# Patient Record
Sex: Male | Born: 1985
Health system: Southern US, Community
[De-identification: ages and names within clinical notes are randomized; demographics above are authoritative.]

## PROBLEM LIST (undated history)

## (undated) HISTORY — PX: HAND SURGERY: SHX662

---

## 2010-03-11 ENCOUNTER — Emergency Department (HOSPITAL_BASED_OUTPATIENT_CLINIC_OR_DEPARTMENT_OTHER)
Admission: EM | Admit: 2010-03-11 | Discharge: 2010-03-11 | Payer: Self-pay | Source: Home / Self Care | Admitting: Emergency Medicine

## 2011-03-19 ENCOUNTER — Encounter (HOSPITAL_BASED_OUTPATIENT_CLINIC_OR_DEPARTMENT_OTHER): Payer: Self-pay | Admitting: *Deleted

## 2011-03-19 ENCOUNTER — Emergency Department (HOSPITAL_BASED_OUTPATIENT_CLINIC_OR_DEPARTMENT_OTHER)
Admission: EM | Admit: 2011-03-19 | Discharge: 2011-03-19 | Disposition: A | Discharge status: Home / Self Care | Payer: Self-pay | Attending: Emergency Medicine | Admitting: Emergency Medicine

## 2011-03-19 DIAGNOSIS — K529 Noninfective gastroenteritis and colitis, unspecified: Secondary | ICD-10-CM

## 2011-03-19 DIAGNOSIS — R197 Diarrhea, unspecified: Secondary | ICD-10-CM | POA: Insufficient documentation

## 2011-03-19 DIAGNOSIS — R109 Unspecified abdominal pain: Secondary | ICD-10-CM | POA: Insufficient documentation

## 2011-03-19 DIAGNOSIS — K5289 Other specified noninfective gastroenteritis and colitis: Secondary | ICD-10-CM | POA: Insufficient documentation

## 2011-03-19 DIAGNOSIS — F17200 Nicotine dependence, unspecified, uncomplicated: Secondary | ICD-10-CM | POA: Insufficient documentation

## 2011-03-19 NOTE — ED Notes (Signed)
Pt states he is here because he has been out of work x 3 days and they will not let him come back until he has a work note

## 2011-03-19 NOTE — ED Provider Notes (Signed)
History     CSN: [id number]  Arrival date & time 03/19/11  1649   First MD Initiated Contact with Patient 03/19/11 1703      Chief Complaint  Patient presents with  . Abdominal Pain  . Diarrhea    (Consider location/radiation/quality/duration/timing/severity/associated sxs/prior treatment) HPI Patient reports had diarrhea with diffuse nonradiating gas-like abdominal pain upon awakening for the past 3 days. All symptoms have resolved today he's had no episodes of diarrhea today. Had 6 episodes of diarrhea yesterday. No treatment today. Yesterday treated himself with Pepto-Bismol without relief. No fever no vomiting no other associated symptoms presently asymptomatic. Requesting a work note so that he can go back to work History reviewed. No pertinent past medical history. Negative History reviewed. No pertinent past surgical history.  History reviewed. No pertinent family history.  History  Substance Use Topics  . Smoking status: Current Everyday Smoker -- 0.5 packs/day  . Smokeless tobacco: Not on file  . Alcohol Use: No      Review of Systems  Constitutional: Negative.   HENT: Negative.   Respiratory: Negative.   Cardiovascular: Negative.   Gastrointestinal: Positive for abdominal pain and diarrhea.  Musculoskeletal: Negative.   Skin: Negative.   Neurological: Negative.   Hematological: Negative.   Psychiatric/Behavioral: Negative.     Allergies  Review of patient's allergies indicates no known allergies.  Home Medications  No current outpatient prescriptions on file.  BP 136/82  Pulse 75  Temp 99.4 F (37.4 C)  Resp 16  Ht 6' (1.829 m)  Wt 225 lb (102.059 kg)  BMI 30.52 kg/m2  SpO2 100%  Physical Exam  Constitutional: He appears well-developed and well-nourished.  HENT:  Head: Normocephalic and atraumatic.  Eyes: Conjunctivae are normal. Pupils are equal, round, and reactive to light.  Neck: Neck supple. No tracheal deviation present. No  thyromegaly present.  Cardiovascular: Normal rate and regular rhythm.   No murmur heard. Pulmonary/Chest: Effort normal and breath sounds normal.  Abdominal: Soft. Bowel sounds are normal. He exhibits no distension and no mass. There is no tenderness. There is no rebound and no guarding.  Genitourinary: Penis normal.  Musculoskeletal: Normal range of motion. He exhibits no edema and no tenderness.  Neurological: He is alert. Coordination normal.  Skin: Skin is warm and dry. No rash noted.  Psychiatric: He has a normal mood and affect.    ED Course  Procedures (including critical care time)  Labs Reviewed - No data to display No results found.   No diagnosis found.    MDM  No further treatment needed Diagnosis : enteritis-resolved        Doug Sou, MD 03/19/11 1726

## 2011-03-19 NOTE — ED Notes (Signed)
Pt c/o abd cramping and diarrhea x 3 days

## 2011-04-12 ENCOUNTER — Encounter (HOSPITAL_BASED_OUTPATIENT_CLINIC_OR_DEPARTMENT_OTHER): Payer: Self-pay | Admitting: *Deleted

## 2011-04-12 ENCOUNTER — Emergency Department (HOSPITAL_BASED_OUTPATIENT_CLINIC_OR_DEPARTMENT_OTHER)
Admission: EM | Admit: 2011-04-12 | Discharge: 2011-04-12 | Disposition: A | Discharge status: Home / Self Care | Payer: Self-pay | Attending: Emergency Medicine | Admitting: Emergency Medicine

## 2011-04-12 DIAGNOSIS — F17200 Nicotine dependence, unspecified, uncomplicated: Secondary | ICD-10-CM | POA: Insufficient documentation

## 2011-04-12 DIAGNOSIS — J069 Acute upper respiratory infection, unspecified: Secondary | ICD-10-CM | POA: Insufficient documentation

## 2011-04-12 MED ORDER — IPRATROPIUM BROMIDE 0.03 % NA SOLN: 2.0000 | Freq: Two times a day (BID) | NASAL | Status: AC

## 2011-04-12 NOTE — ED Notes (Signed)
Pt states he has been congested x 3 days. Prod cough with yellow, white sputum. Some CP with cough. Hx allergies.

## 2011-04-12 NOTE — ED Provider Notes (Signed)
History   This chart was scribed for Toy Baker, MD scribed by Magnus Sinning. The patient was seen in room MH09/MH09 seen at 16:06   CSN: [id number]  Arrival date & time 04/12/11  4132   First MD Initiated Contact with Patient 04/12/11 1558      Chief Complaint  Patient presents with  . Nasal Congestion    (Consider location/radiation/quality/duration/timing/severity/associated sxs/prior treatment) HPI Jeffrey Wood is a 26 y.o. male who presents to the Emergency Department complaining of constant moderate throat pain with associated nasal congestion, mild chest pain and cough onset three days ago. Pt says throat pain is worse in the morning and adds that it is swollen and hurts to swallow. He has been having congestion discharge that he describes is yellow. Denies fever, otalgias ,vomiting, diarrhea. Pt is a current smoker.  History reviewed. No pertinent past medical history.  History reviewed. No pertinent past surgical history.  History reviewed. No pertinent family history.  History  Substance Use Topics  . Smoking status: Current Everyday Smoker -- 0.5 packs/day  . Smokeless tobacco: Not on file  . Alcohol Use: No      Review of Systems  Constitutional: Negative for fever.  HENT: Positive for congestion. Negative for ear pain.   Respiratory: Positive for cough.   Cardiovascular: Positive for chest pain.  Gastrointestinal: Negative for vomiting and diarrhea.  All other systems reviewed and are negative.    Allergies  Review of patient's allergies indicates no known allergies.  Home Medications  No current outpatient prescriptions on file.  BP 143/88  Pulse 84  Temp(Src) 98.1 F (36.7 C) (Oral)  Resp 20  Ht 6' (1.829 m)  Wt 240 lb (108.863 kg)  BMI 32.55 kg/m2  SpO2 98%  Physical Exam  Nursing note and vitals reviewed. Constitutional: He is oriented to person, place, and time. He appears well-developed and well-nourished. No distress.  HENT:    Head: Normocephalic and atraumatic.  Right Ear: External ear normal.  Left Ear: External ear normal.  Mouth/Throat: Oropharynx is clear and moist.       No sinus pain  Eyes: EOM are normal. Pupils are equal, round, and reactive to light.  Neck: Normal range of motion. Neck supple. No tracheal deviation present.       No lymphadenopathy   Cardiovascular: Normal rate.   Pulmonary/Chest: Effort normal. No respiratory distress.  Abdominal: Soft. He exhibits no distension.  Musculoskeletal: Normal range of motion. He exhibits no edema.  Neurological: He is alert and oriented to person, place, and time. No sensory deficit.  Skin: Skin is warm and dry.  Psychiatric: He has a normal mood and affect. His behavior is normal.    ED Course  Procedures (including critical care time) DIAGNOSTIC STUDIES: Oxygen Saturation is 98% on room air, normal by my interpretation.    COORDINATION OF CARE: 16:12: Physician informs intent to d/c patient home with nasal spray with recommendations. Patient agrees with plan of action set at this time.  Labs Reviewed - No data to display No results found.   No diagnosis found.    MDM  I personally performed the services described in this documentation, which was scribed in my presence. The recorded information has been reviewed and considered.  Pt to be treated for a Orma Flaming, MD 04/12/11 [phone]

## 2011-04-12 NOTE — Discharge Instructions (Signed)

## 2011-04-12 NOTE — ED Notes (Signed)
rx x 1 given for atrovent

## 2016-03-04 ENCOUNTER — Emergency Department (HOSPITAL_BASED_OUTPATIENT_CLINIC_OR_DEPARTMENT_OTHER)
Admission: EM | Admit: 2016-03-04 | Discharge: 2016-03-04 | Disposition: A | Discharge status: Home / Self Care | Payer: Self-pay | Attending: Emergency Medicine | Admitting: Emergency Medicine

## 2016-03-04 ENCOUNTER — Emergency Department (HOSPITAL_BASED_OUTPATIENT_CLINIC_OR_DEPARTMENT_OTHER): Payer: Self-pay

## 2016-03-04 ENCOUNTER — Encounter (HOSPITAL_BASED_OUTPATIENT_CLINIC_OR_DEPARTMENT_OTHER): Payer: Self-pay | Admitting: *Deleted

## 2016-03-04 DIAGNOSIS — R079 Chest pain, unspecified: Secondary | ICD-10-CM

## 2016-03-04 DIAGNOSIS — F17200 Nicotine dependence, unspecified, uncomplicated: Secondary | ICD-10-CM | POA: Insufficient documentation

## 2016-03-04 DIAGNOSIS — R0789 Other chest pain: Secondary | ICD-10-CM | POA: Insufficient documentation

## 2016-03-04 LAB — COMPREHENSIVE METABOLIC PANEL
ALBUMIN: 4.6 g/dL (ref 3.5–5.0)
ALT: 24 U/L (ref 17–63)
AST: 20 U/L (ref 15–41)
Alkaline Phosphatase: 61 U/L (ref 38–126)
Anion gap: 8 (ref 5–15)
BUN: 12 mg/dL (ref 6–20)
CHLORIDE: 102 mmol/L (ref 101–111)
CO2: 30 mmol/L (ref 22–32)
Calcium: 9.1 mg/dL (ref 8.9–10.3)
Creatinine, Ser: 1 mg/dL (ref 0.61–1.24)
GFR calc Af Amer: >60 mL/min (ref >60)
GFR calc non Af Amer: >60 mL/min (ref >60)
GLUCOSE: 84 mg/dL (ref 65–99)
POTASSIUM: 3.4 mmol/L — AB (ref 3.5–5.1)
Sodium: 140 mmol/L (ref 135–145)
Total Bilirubin: 0.6 mg/dL (ref 0.3–1.2)
Total Protein: 7.5 g/dL (ref 6.5–8.1)

## 2016-03-04 LAB — CBC WITH DIFFERENTIAL/PLATELET
BASOS PCT: 0 %
Basophils Absolute: 0 10*3/uL (ref 0.0–0.1)
EOS PCT: 2 %
Eosinophils Absolute: 0.1 10*3/uL (ref 0.0–0.7)
HCT: 44.8 % (ref 39.0–52.0)
Hemoglobin: 15.1 g/dL (ref 13.0–17.0)
Lymphocytes Relative: 41 %
Lymphs Abs: 2.4 10*3/uL (ref 0.7–4.0)
MCH: 27 pg (ref 26.0–34.0)
MCHC: 33.7 g/dL (ref 30.0–36.0)
MCV: 80 fL (ref 78.0–100.0)
MONO ABS: 0.4 10*3/uL (ref 0.1–1.0)
Monocytes Relative: 7 %
NEUTROS ABS: 2.9 10*3/uL (ref 1.7–7.7)
Neutrophils Relative %: 50 %
PLATELETS: 188 10*3/uL (ref 150–400)
RBC: 5.6 MIL/uL (ref 4.22–5.81)
RDW: 13.1 % (ref 11.5–15.5)
WBC: 5.8 10*3/uL (ref 4.0–10.5)

## 2016-03-04 LAB — TROPONIN I

## 2016-03-04 MED ORDER — ACETAMINOPHEN 500 MG PO TABS
1000.0000 mg | ORAL_TABLET | Freq: Once | ORAL | Status: AC
  Administered 2016-03-04: 1000 mg via ORAL
  Filled 2016-03-04: qty 2

## 2016-03-04 NOTE — ED Provider Notes (Signed)
[facility]   CSN: [id number] Arrival date & time: 03/04/16  1740   By signing my name below, I, Jeffrey Wood, attest that this documentation has been prepared under the direction and in the presence of Jeffrey Conn, MD . Electronically Signed: Nelwyn Wood, Scribe. 03/04/2016. 9:46 PM.  History   Chief Complaint Chief Complaint  Patient presents with  . Chest Pain   The history is provided by the patient and a relative. No language interpreter was used.    HPI Comments:  Jeffrey Wood is an otherwise healthy 31 y.o. male who presents to the Emergency Department complaining of intermittent, unchanged chest pain onset this week. He describes his symptoms as a pressure-like pain that is worse when he is laying down. Pt reports associated headaches and heart palpitations. He describes his palpitations as if his heart is "fluttering". Pt has not taken anything at home for his symptoms. He denies any lower extremity swelling, rhinorrhea, cough, congestion, SOB or vomiting. Pt is an everyday smoker, does not drink alcohol, and has no hx of recreation drug use. He has not traveled recently and has no hx of blood clots.   History reviewed. No pertinent past medical history.  There are no active problems to display for this patient.   Past Surgical History:  Procedure Laterality Date  . HAND SURGERY      Home Medications    Prior to Admission medications   Medication Sig Start Date End Date Taking? Authorizing Provider  ipratropium (ATROVENT) 0.03 % nasal spray Place 2 sprays into the nose every 12 (twelve) hours. 04/12/11 04/11/12  Lorre Nick, MD    Family History No family history on file.  Social History Social History  Substance Use Topics  . Smoking status: Current Every Day Smoker    Packs/day: 0.50  . Smokeless tobacco: Never Used  . Alcohol use No     Allergies   Patient has no known allergies.   Review of Systems Review of  Systems 10 Systems reviewed and are negative for acute change except as noted in the HPI.  Physical Exam Updated Vital Signs BP 145/79 (BP Location: Left Arm)   Pulse 65   Temp 98.4 F (36.9 C) (Oral)   Resp 20   Ht 6' (1.829 m)   Wt 205 lb (93 kg)   SpO2 100%   BMI 27.80 kg/m   Physical Exam  Constitutional: He is oriented to person, place, and time. He appears well-developed and well-nourished. No distress.  HENT:  Head: Normocephalic and atraumatic.  Nose: Nose normal.  Eyes: Conjunctivae and EOM are normal. Pupils are equal, round, and reactive to light. Right eye exhibits no discharge. Left eye exhibits no discharge. No scleral icterus.  Neck: Normal range of motion. Neck supple.  Cardiovascular: Normal rate and regular rhythm.  Exam reveals no gallop and no friction rub.   No murmur heard. Pulmonary/Chest: Effort normal and breath sounds normal. No stridor. No respiratory distress. He has no rales.  Abdominal: Soft. He exhibits no distension. There is no tenderness.  Musculoskeletal: He exhibits no edema or tenderness.  Neurological: He is alert and oriented to person, place, and time.  Skin: Skin is warm and dry. No rash noted. He is not diaphoretic. No erythema.  Psychiatric: He has a normal mood and affect.  Vitals reviewed.    ED Treatments / Results  DIAGNOSTIC STUDIES:  Oxygen Saturation is 100% on RA, normal by my interpretation.    COORDINATION  OF CARE:  9:47 PM Discussed treatment plan with pt at bedside which includes referral to PCP and he agreed to plan. Pt educated on potential for acid reflux.   Labs (all labs ordered are listed, but only abnormal results are displayed) Labs Reviewed  COMPREHENSIVE METABOLIC PANEL - Abnormal; Notable for the following:       Result Value   Potassium 3.4 (*)    All other components within normal limits  CBC WITH DIFFERENTIAL/PLATELET  TROPONIN I    EKG  EKG Interpretation  Date/Time:  Tuesday March 04 2016 17:52:49 EST Ventricular Rate:  58 PR Interval:  140 QRS Duration: 108 QT Interval:  412 QTC Calculation: 404 R Axis:   28 Text Interpretation:  Sinus bradycardia Incomplete right bundle branch block Borderline ECG No old tracing to compare Confirmed by [facility]CARDAMA MD, Jeffrey Wood [phone]([id number]) on 03/04/2016 6:09:42 PM       Radiology Dg Chest 2 View  Result Date: 03/04/2016 CLINICAL DATA:  Chest pain and heart palpitations for 1 week EXAM: CHEST  2 VIEW COMPARISON:  None. FINDINGS: The heart size and mediastinal contours are within normal limits. Both lungs are clear. The visualized skeletal structures are unremarkable. IMPRESSION: No active cardiopulmonary disease. Electronically Signed   By: Jeffrey Wood  Jeffrey Wood M.D.   On: 03/04/2016 19:43    Procedures Procedures (including critical care time)  Medications Ordered in ED Medications  acetaminophen (TYLENOL) tablet 1,000 mg (1,000 mg Oral Given 03/04/16 2156)     Initial Impression / Assessment and Plan / ED Course  I have reviewed the triage vital signs and the nursing notes.  Pertinent labs & imaging results that were available during my care of the patient were reviewed by me and considered in my medical decision making (see chart for details).  Clinical Course     Atypical chest pain highly inconsistent with ACS. EKG without acute ischemic changes or evidence of pericarditis. Triage labs reassuring with negative troponin. HEAR <2. Do not feel that additional cardiac workup is necessary at this time.  PERC negative. Doubt PE.  Chest x-ray without evidence suggestive of pneumonia, pneumothorax, pneumomediastinum.  No abnormal contour of the mediastinum to suggest dissection. No evidence of acute injuries.  Presentation is classic for aortic dissection or esophageal perforation.  The patient is safe for discharge with strict return precautions.   Final Clinical Impressions(s) / ED Diagnoses   Final diagnoses:  Nonspecific chest pain     Disposition: Discharge  Condition: Good  I have discussed the results, Dx and Tx plan with the patient who expressed understanding and agree(s) with the plan. Discharge instructions discussed at great length. The patient was given strict return precautions who verbalized understanding of the instructions. No further questions at time of discharge.    Discharge Medication List as of 03/04/2016  9:50 PM      Follow Up: primary care provider  Schedule an appointment as soon as possible for a visit     I personally performed the services described in this documentation, which was scribed in my presence. The recorded information has been reviewed and is accurate.        Jeffrey ConnPedro Eduardo Cardama, MD 03/05/16 [phone]

## 2016-03-04 NOTE — ED Triage Notes (Signed)
Heaviness in his chest and heart fluttering for a week.

## 2016-03-04 NOTE — ED Notes (Signed)
Pt asked if he could drink some coffee. Pt instructed to not eat or drink until MD has evaluated pt.

## 2018-05-31 ENCOUNTER — Emergency Department (HOSPITAL_BASED_OUTPATIENT_CLINIC_OR_DEPARTMENT_OTHER)
Admission: EM | Admit: 2018-05-31 | Discharge: 2018-05-31 | Disposition: A | Discharge status: Home / Self Care | Payer: Self-pay | Attending: Emergency Medicine | Admitting: Emergency Medicine

## 2018-05-31 ENCOUNTER — Encounter (HOSPITAL_BASED_OUTPATIENT_CLINIC_OR_DEPARTMENT_OTHER): Payer: Self-pay

## 2018-05-31 ENCOUNTER — Other Ambulatory Visit: Payer: Self-pay

## 2018-05-31 DIAGNOSIS — F17200 Nicotine dependence, unspecified, uncomplicated: Secondary | ICD-10-CM | POA: Insufficient documentation

## 2018-05-31 DIAGNOSIS — R42 Dizziness and giddiness: Secondary | ICD-10-CM | POA: Insufficient documentation

## 2018-05-31 DIAGNOSIS — Z79899 Other long term (current) drug therapy: Secondary | ICD-10-CM | POA: Insufficient documentation

## 2018-05-31 LAB — CBC WITH DIFFERENTIAL/PLATELET
Abs Immature Granulocytes: 0.01 10*3/uL (ref 0.00–0.07)
Basophils Absolute: 0 10*3/uL (ref 0.0–0.1)
Basophils Relative: 0 %
Eosinophils Absolute: 0.1 10*3/uL (ref 0.0–0.5)
Eosinophils Relative: 2 %
HCT: 46.6 % (ref 39.0–52.0)
Hemoglobin: 15 g/dL (ref 13.0–17.0)
Immature Granulocytes: 0 %
Lymphocytes Relative: 40 %
Lymphs Abs: 2.3 10*3/uL (ref 0.7–4.0)
MCH: 26.9 pg (ref 26.0–34.0)
MCHC: 32.2 g/dL (ref 30.0–36.0)
MCV: 83.5 fL (ref 80.0–100.0)
Monocytes Absolute: 0.4 10*3/uL (ref 0.1–1.0)
Monocytes Relative: 6 %
Neutro Abs: 3 10*3/uL (ref 1.7–7.7)
Neutrophils Relative %: 52 %
Platelets: 184 10*3/uL (ref 150–400)
RBC: 5.58 MIL/uL (ref 4.22–5.81)
RDW: 13.3 % (ref 11.5–15.5)
WBC: 5.9 10*3/uL (ref 4.0–10.5)
nRBC: 0 % (ref 0.0–0.2)

## 2018-05-31 LAB — BASIC METABOLIC PANEL
Anion gap: 10 (ref 5–15)
BUN: 20 mg/dL (ref 6–20)
CO2: 23 mmol/L (ref 22–32)
Calcium: 8.8 mg/dL — ABNORMAL LOW (ref 8.9–10.3)
Chloride: 103 mmol/L (ref 98–111)
Creatinine, Ser: 1.11 mg/dL (ref 0.61–1.24)
GFR calc Af Amer: >60 mL/min (ref >60)
GFR calc non Af Amer: >60 mL/min (ref >60)
Glucose, Bld: 109 mg/dL — ABNORMAL HIGH (ref 70–99)
Potassium: 3.8 mmol/L (ref 3.5–5.1)
Sodium: 136 mmol/L (ref 135–145)

## 2018-05-31 MED ORDER — MECLIZINE HCL 25 MG PO TABS
25.0000 mg | ORAL_TABLET | Freq: Once | ORAL | Status: AC
  Administered 2018-05-31: 25 mg via ORAL
  Filled 2018-05-31: qty 1

## 2018-05-31 MED ORDER — MECLIZINE HCL 25 MG PO TABS: 25.0000 mg | ORAL_TABLET | Freq: Three times a day (TID) | ORAL | 0 refills | Status: AC | PRN

## 2018-05-31 MED ORDER — ONDANSETRON 4 MG PO TBDP: 4.0000 mg | ORAL_TABLET | Freq: Three times a day (TID) | ORAL | 0 refills | Status: AC | PRN

## 2018-05-31 MED FILL — ONDANSETRON ODT 4 MG TABLET: 4 | 3 days supply | Qty: 10 | Fill #0

## 2018-05-31 NOTE — ED Triage Notes (Addendum)
Pt c/o dizziness when he is lying down and turns from side to side x "months" causing him to have difficulty sleeping-NAD-steady gait

## 2018-05-31 NOTE — ED Notes (Signed)
C/o dizziness when lying and turns head side to side

## 2018-05-31 NOTE — ED Provider Notes (Signed)
[facility] EMERGENCY DEPARTMENT Provider Note   CSN: [id number] Arrival date & time: 05/31/18  1342  History   Chief Complaint Chief Complaint  Patient presents with  . Dizziness    HPI Jeffrey Wood is a 33 y.o. male with no significant past medical history who presents for evaluation of dizziness.  Patient states he has had intermittent episodes of dizziness x months.  Patient states symptoms well occur suddenly and he will get them frequently for 1-2 days and then resolved.  Patient states symptoms occur when he is moving quickly, turns his head quickly, rolls over in bed.  Patient states if he lays still and does not move symptoms resolve.  Patient states occasionally he will get tinnitus in his ears, however the last few times he has had these episodes of dizziness he has not had the tinnitus.  Denies any recent history of head trauma or injury.  He denies any coagulation.  Denies fever, chills, headache, facial asymmetry, slurred speech, vision changes, unilateral weakness, chest pain, shortness of breath, neck pain, neck stiffness, abdominal pain, diarrhea or dysuria, tremors, palpitations.  Patient states he is ambulatory without difficulty.  Patient states current episode of dizziness started yesterday evening.  He has had some intermittent nausea without emesis.  Denies previous head imaging.  Has not taken anything for symptoms PTA. Denies additional aggravating or alleviating factors.   PCP--none  History obtained by patient.  No interpreter was used.     HPI  History reviewed. No pertinent past medical history.  There are no active problems to display for this patient.   Past Surgical History:  Procedure Laterality Date  . HAND SURGERY          Home Medications    Prior to Admission medications   Medication Sig Start Date End Date Taking? Authorizing Provider  ipratropium (ATROVENT) 0.03 % nasal spray Place 2 sprays into the nose every 12 (twelve)  hours. 04/12/11 04/11/12  Lorre Nick, MD  meclizine (ANTIVERT) 25 MG tablet Take 1 tablet (25 mg total) by mouth 3 (three) times daily as needed for dizziness. 05/31/18   Arvada Seaborn A, PA-C  ondansetron (ZOFRAN ODT) 4 MG disintegrating tablet Take 1 tablet (4 mg total) by mouth every 8 (eight) hours as needed for nausea or vomiting. 05/31/18   Jeter Tomey A, PA-C    Family History No family history on file.  Social History Social History   Tobacco Use  . Smoking status: Current Every Day Smoker    Packs/day: 0.50  . Smokeless tobacco: Never Used  Substance Use Topics  . Alcohol use: Yes    Comment: occ  . Drug use: Yes    Types: Marijuana     Allergies   Patient has no known allergies.   Review of Systems Review of Systems  Constitutional: Negative.   HENT: Negative.   Respiratory: Negative.   Cardiovascular: Negative.   Gastrointestinal: Positive for nausea. Negative for abdominal distention, abdominal pain, anal bleeding, blood in stool, constipation, diarrhea, rectal pain and vomiting.  Musculoskeletal: Negative.   Skin: Negative.   Neurological: Positive for dizziness. Negative for tremors, seizures, syncope, facial asymmetry, speech difficulty, weakness, light-headedness, numbness and headaches.  All other systems reviewed and are negative.    Physical Exam Updated Vital Signs BP 130/82   Pulse 72   Temp 98.4 F (36.9 C) (Oral)   Resp (!) 24   Ht 6' (1.829 m)   Wt 97.5 kg   SpO2 99%  BMI 29.16 kg/m   Physical Exam Physical Exam  Constitutional: Pt is oriented to person, place, and time. Pt appears well-developed and well-nourished. No distress.  HENT:  Head: Normocephalic and atraumatic.  Mouth/Throat: Oropharynx is clear and moist.  Eyes: Conjunctivae and EOM are normal. Pupils are equal, round, and reactive to light. No scleral icterus.  No vertical or rotational nystagmus One beat of horizontal nystagmus to the left  Neck: Normal  range of motion. Neck supple.  Full active and passive ROM without pain No midline or paraspinal tenderness No nuchal rigidity or meningeal signs  Cardiovascular: Normal rate, regular rhythm and intact distal pulses.   Pulmonary/Chest: Effort normal and breath sounds normal. No respiratory distress. Pt has no wheezes. No rales.  Abdominal: Soft. Bowel sounds are normal. There is no tenderness. There is no rebound and no guarding.  Musculoskeletal: Normal range of motion.  Lymphadenopathy:    No cervical adenopathy.  Neurological: Pt. is alert and oriented to person, place, and time. He has normal reflexes. No cranial nerve deficit.  Exhibits normal muscle tone. Coordination normal.  Mental Status:  Alert, oriented, thought content appropriate. Speech fluent without evidence of aphasia. Able to follow 2 step commands without difficulty.  Cranial Nerves:  II:  Peripheral visual fields grossly normal, pupils equal, round, reactive to light III,IV, VI: ptosis not present, extra-ocular motions intact bilaterally  V,VII: smile symmetric, facial light touch sensation equal VIII: hearing grossly normal bilaterally  IX,X: midline uvula rise  XI: bilateral shoulder shrug equal and strong XII: midline tongue extension  Motor:  5/5 in upper and lower extremities bilaterally including strong and equal grip strength and dorsiflexion/plantar flexion Sensory: Pinprick and light touch normal in all extremities.  Deep Tendon Reflexes: 2+ and symmetric  Cerebellar: normal finger-to-nose with bilateral upper extremities Gait: normal gait and balance. No gait ataxia. CV: distal pulses palpable throughout   Skin: Skin is warm and dry. No rash noted. Pt is not diaphoretic.  Psychiatric: Pt has a normal mood and affect. Behavior is normal. Judgment and thought content normal.  Nursing note and vitals reviewed.  ED Treatments / Results  Labs (all labs ordered are listed, but only abnormal results are  displayed) Labs Reviewed  BASIC METABOLIC PANEL - Abnormal; Notable for the following components:      Result Value   Glucose, Bld 109 (*)    Calcium 8.8 (*)    All other components within normal limits  CBC WITH DIFFERENTIAL/PLATELET    EKG EKG Interpretation  Date/Time:  Monday May 31 2018 14:07:16 EDT Ventricular Rate:  71 PR Interval:    QRS Duration: 105 QT Interval:  370 QTC Calculation: 402 R Axis:   51 Text Interpretation:  Sinus rhythm RSR' in V1 or V2, probably normal variant ST elevation, consider inferior injury Incomplete right bundle branch block No significant change since last tracing Confirmed by Vanetta Mulders [phone]) on 05/31/2018 2:15:58 PM   Radiology No results found.  Procedures Procedures (including critical care time)  Medications Ordered in ED Medications  meclizine (ANTIVERT) tablet 25 mg (25 mg Oral Given 05/31/18 1417)    Initial Impression / Assessment and Plan / ED Course  I have reviewed the triage vital signs and the nursing notes.  Pertinent labs & imaging results that were available during my care of the patient were reviewed by me and considered in my medical decision making (see chart for details).  33 year old male appears otherwise well presents for evaluation dizziness.  Afebrile, nonseptic,  non-ill-appearing.  Patient with sudden onset dizzy episodes lasting 1-2 days over the last 4-5 months. Has had 4-5 episodes over this time. Symptoms sudden onset worse with head movement, specifically moving his head or rolling over in bed at night.  Has associated severe nausea without emesis.  Denies sudden onset thunderclap headache, unilateral weakness, slurred speech, gait abnormalities.  Has also had some tinnitus with his previous episodes, however not with his last 2 episodes of dizziness.  Most recent episode began yesterday evening. States symptoms have improved on their own, however still some slight dizziness while in the department  with head movement.  Normal neurologic exam without neurologic deficits.  No neck stiffness or neck rigidity.  No meningismus.  No tenderness over carotid.  He has no headache or facial numbness to suggest dissection as cause of his symptoms. Does have 1 beat of horizontal nystagmus to the left.  No rotational or vertical nystagmus.  No recent head injury or head trauma.  No anticoagulation.  Negative skew test.  Will obtain labs, EKG give meclizine and reevaluate.  Patient would like to not have imaging at this time as he states he "cannot afford it." Given reassuring exam and low suspicion for central cause of dizziness I feel ok with this decision at this time.  He is ambulatory in department without ataxic gait.  1515: CBC without leukocytosis, hemoglobin 15.0, metabolic panel without electrolyte, renal or liver abnormality, EKG without ischemic changes, similar to previous EKGs.  Patient with improvement in symptoms with meclizine.  Requesting DC home at this time.  Patient states "I have things I have to do today."  Discussed with patient DC home with meclizine and Zofran for symptoms.  Patient can follow-up with the ENT for possible vertigo or neurology for his recurrent dizziness.    Patient with unilateral nystagmus, negative skew test and normal neurologic exam.  No vertical or rotational nystagmus. Patient normal finger-nose and normal gait.  No slurred speech or weakness.  Doubt CVA or other central cause of vertigo.  History and physical consistent with peripheral vertigo symptoms. Patient instructed to followup with her primary care physician or neurology within 3 days for further evaluation. They are to return to the emergency department for new neurologic symptoms, loss of vision or other concerning symptoms.     Final Clinical Impressions(s) / ED Diagnoses   Final diagnoses:  Dizziness    ED Discharge Orders         Ordered    meclizine (ANTIVERT) 25 MG tablet  3 times daily PRN      05/31/18 1521    ondansetron (ZOFRAN ODT) 4 MG disintegrating tablet  Every 8 hours PRN     05/31/18 1521           Milarose Savich A, PA-C 05/31/18 1530    Vanetta MuldersZackowski, Scott, MD 06/02/18 [phone]

## 2018-05-31 NOTE — Discharge Instructions (Signed)
Evaluated today for dizziness.  Your EKG and lab work was unremarkable.  I have given you a prescription for meclizine.with the dizziness as well as Zofran for nausea.  Please take as prescribed.  I would suggest you follow-up with neurology for your recurrent dizziness.  Their contact information is listed on your discharge paperwork.  You will need to call to schedule an appointment.  Return to the ED for worsening symptoms.

## 2018-07-17 ENCOUNTER — Emergency Department (HOSPITAL_BASED_OUTPATIENT_CLINIC_OR_DEPARTMENT_OTHER): Payer: No Typology Code available for payment source

## 2018-07-17 ENCOUNTER — Emergency Department (HOSPITAL_BASED_OUTPATIENT_CLINIC_OR_DEPARTMENT_OTHER)
Admission: EM | Admit: 2018-07-17 | Discharge: 2018-07-17 | Disposition: A | Discharge status: Home / Self Care | Payer: No Typology Code available for payment source | Attending: Emergency Medicine | Admitting: Emergency Medicine

## 2018-07-17 ENCOUNTER — Other Ambulatory Visit: Payer: Self-pay

## 2018-07-17 ENCOUNTER — Encounter (HOSPITAL_BASED_OUTPATIENT_CLINIC_OR_DEPARTMENT_OTHER): Payer: Self-pay | Admitting: Emergency Medicine

## 2018-07-17 DIAGNOSIS — R0602 Shortness of breath: Secondary | ICD-10-CM | POA: Insufficient documentation

## 2018-07-17 DIAGNOSIS — S299XXA Unspecified injury of thorax, initial encounter: Secondary | ICD-10-CM | POA: Diagnosis present

## 2018-07-17 DIAGNOSIS — F1210 Cannabis abuse, uncomplicated: Secondary | ICD-10-CM | POA: Insufficient documentation

## 2018-07-17 DIAGNOSIS — S20212A Contusion of left front wall of thorax, initial encounter: Secondary | ICD-10-CM | POA: Diagnosis not present

## 2018-07-17 DIAGNOSIS — Y929 Unspecified place or not applicable: Secondary | ICD-10-CM | POA: Insufficient documentation

## 2018-07-17 DIAGNOSIS — Y998 Other external cause status: Secondary | ICD-10-CM | POA: Insufficient documentation

## 2018-07-17 DIAGNOSIS — W2210XA Striking against or struck by unspecified automobile airbag, initial encounter: Secondary | ICD-10-CM | POA: Insufficient documentation

## 2018-07-17 DIAGNOSIS — F17210 Nicotine dependence, cigarettes, uncomplicated: Secondary | ICD-10-CM | POA: Insufficient documentation

## 2018-07-17 DIAGNOSIS — Y9389 Activity, other specified: Secondary | ICD-10-CM | POA: Diagnosis not present

## 2018-07-17 MED ORDER — CYCLOBENZAPRINE HCL 10 MG PO TABS: 10.0000 mg | ORAL_TABLET | Freq: Two times a day (BID) | ORAL | 0 refills | Status: AC | PRN

## 2018-07-17 MED ORDER — KETOROLAC TROMETHAMINE 60 MG/2ML IM SOLN
60.0000 mg | Freq: Once | INTRAMUSCULAR | Status: AC
  Administered 2018-07-17: 12:00:00 60 mg via INTRAMUSCULAR
  Filled 2018-07-17: qty 2

## 2018-07-17 MED ORDER — CYCLOBENZAPRINE HCL 10 MG PO TABS
10.0000 mg | ORAL_TABLET | Freq: Once | ORAL | Status: AC
  Administered 2018-07-17: 12:00:00 10 mg via ORAL
  Filled 2018-07-17: qty 1

## 2018-07-17 MED ORDER — ACETAMINOPHEN 325 MG PO TABS
650.0000 mg | ORAL_TABLET | Freq: Once | ORAL | Status: AC
  Administered 2018-07-17: 12:00:00 650 mg via ORAL
  Filled 2018-07-17: qty 2

## 2018-07-17 NOTE — ED Notes (Signed)
Pt verbalized understanding of dc instructions.

## 2018-07-17 NOTE — Discharge Instructions (Signed)
X-ray today showed no signs of broken ribs but you do have a bad bruise and probably some muscle spasm.  You can take Aleve and Tylenol every 6 hours together and the muscle relaxer as needed.  If you develop shortness of breath or feeling dizzy or lightheaded like you might pass out please return for repeat evaluation.
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
